# Patient Record
Sex: Female | Born: 1960 | Race: White | Hispanic: No | Marital: Married | Smoking: Former smoker
Health system: Southern US, Community
[De-identification: ages and names within clinical notes are randomized; demographics above are authoritative.]

## PROBLEM LIST (undated history)

## (undated) DIAGNOSIS — Z8619 Personal history of other infectious and parasitic diseases: Secondary | ICD-10-CM

## (undated) DIAGNOSIS — B379 Candidiasis, unspecified: Secondary | ICD-10-CM

## (undated) DIAGNOSIS — B009 Herpesviral infection, unspecified: Secondary | ICD-10-CM

## (undated) DIAGNOSIS — R002 Palpitations: Secondary | ICD-10-CM

## (undated) DIAGNOSIS — R000 Tachycardia, unspecified: Secondary | ICD-10-CM

## (undated) DIAGNOSIS — A749 Chlamydial infection, unspecified: Secondary | ICD-10-CM

## (undated) DIAGNOSIS — N970 Female infertility associated with anovulation: Secondary | ICD-10-CM

## (undated) DIAGNOSIS — N6019 Diffuse cystic mastopathy of unspecified breast: Secondary | ICD-10-CM

## (undated) DIAGNOSIS — E669 Obesity, unspecified: Secondary | ICD-10-CM

## (undated) HISTORY — PX: APPENDECTOMY: SHX54

## (undated) HISTORY — DX: Personal history of other infectious and parasitic diseases: Z86.19

## (undated) HISTORY — DX: Herpesviral infection, unspecified: B00.9

## (undated) HISTORY — PX: WISDOM TOOTH EXTRACTION: SHX21

## (undated) HISTORY — DX: Diffuse cystic mastopathy of unspecified breast: N60.19

## (undated) HISTORY — DX: Female infertility associated with anovulation: N97.0

## (undated) HISTORY — PX: INNER EAR SURGERY: SHX679

## (undated) HISTORY — PX: NASAL SEPTUM SURGERY: SHX37

## (undated) HISTORY — DX: Candidiasis, unspecified: B37.9

## (undated) HISTORY — DX: Obesity, unspecified: E66.9

## (undated) HISTORY — PX: LAPAROSCOPY: SHX197

## (undated) HISTORY — DX: Chlamydial infection, unspecified: A74.9

## (undated) HISTORY — DX: Palpitations: R00.2

## (undated) HISTORY — DX: Tachycardia, unspecified: R00.0

## (undated) HISTORY — PX: ECTOPIC PREGNANCY SURGERY: SHX613

---

## 1998-09-22 ENCOUNTER — Emergency Department (HOSPITAL_COMMUNITY): Admission: EM | Admit: 1998-09-22 | Discharge: 1998-09-22 | Payer: Self-pay | Admitting: Emergency Medicine

## 2000-11-26 ENCOUNTER — Other Ambulatory Visit: Admission: RE | Admit: 2000-11-26 | Discharge: 2000-11-26 | Payer: Self-pay | Admitting: Obstetrics and Gynecology

## 2001-06-26 ENCOUNTER — Ambulatory Visit (HOSPITAL_COMMUNITY): Admission: RE | Admit: 2001-06-26 | Discharge: 2001-06-26 | Payer: Self-pay | Admitting: Gastroenterology

## 2002-09-22 ENCOUNTER — Other Ambulatory Visit: Admission: RE | Admit: 2002-09-22 | Discharge: 2002-09-22 | Payer: Self-pay | Admitting: Obstetrics and Gynecology

## 2002-10-05 ENCOUNTER — Encounter: Admission: RE | Admit: 2002-10-05 | Discharge: 2002-10-05 | Payer: Self-pay | Admitting: Obstetrics and Gynecology

## 2002-10-05 ENCOUNTER — Encounter: Payer: Self-pay | Admitting: Obstetrics and Gynecology

## 2003-05-06 ENCOUNTER — Encounter: Admission: RE | Admit: 2003-05-06 | Discharge: 2003-05-06 | Payer: Self-pay | Admitting: Obstetrics and Gynecology

## 2003-05-06 ENCOUNTER — Encounter: Payer: Self-pay | Admitting: Obstetrics and Gynecology

## 2005-05-02 ENCOUNTER — Ambulatory Visit: Payer: Self-pay | Admitting: Internal Medicine

## 2006-03-07 ENCOUNTER — Ambulatory Visit: Payer: Self-pay | Admitting: Internal Medicine

## 2006-09-06 ENCOUNTER — Ambulatory Visit (HOSPITAL_COMMUNITY): Admission: RE | Admit: 2006-09-06 | Discharge: 2006-09-06 | Payer: Self-pay | Admitting: Gastroenterology

## 2006-11-13 ENCOUNTER — Encounter: Admission: RE | Admit: 2006-11-13 | Discharge: 2006-11-13 | Payer: Self-pay | Admitting: Obstetrics and Gynecology

## 2006-11-21 ENCOUNTER — Encounter (INDEPENDENT_AMBULATORY_CARE_PROVIDER_SITE_OTHER): Payer: Self-pay | Admitting: *Deleted

## 2006-11-21 ENCOUNTER — Ambulatory Visit (HOSPITAL_COMMUNITY): Admission: RE | Admit: 2006-11-21 | Discharge: 2006-11-21 | Payer: Self-pay | Admitting: General Surgery

## 2007-03-31 ENCOUNTER — Ambulatory Visit: Payer: Self-pay | Admitting: Internal Medicine

## 2008-03-22 ENCOUNTER — Encounter: Admission: RE | Admit: 2008-03-22 | Discharge: 2008-03-22 | Payer: Self-pay | Admitting: Obstetrics and Gynecology

## 2008-03-31 ENCOUNTER — Ambulatory Visit: Payer: Self-pay | Admitting: Internal Medicine

## 2009-05-06 ENCOUNTER — Encounter (INDEPENDENT_AMBULATORY_CARE_PROVIDER_SITE_OTHER): Payer: Self-pay | Admitting: *Deleted

## 2009-06-14 ENCOUNTER — Encounter (INDEPENDENT_AMBULATORY_CARE_PROVIDER_SITE_OTHER): Payer: Self-pay | Admitting: *Deleted

## 2009-11-25 ENCOUNTER — Encounter: Admission: RE | Admit: 2009-11-25 | Discharge: 2009-11-25 | Payer: Self-pay | Admitting: Obstetrics and Gynecology

## 2010-02-23 ENCOUNTER — Ambulatory Visit: Payer: Self-pay | Admitting: Internal Medicine

## 2010-02-23 DIAGNOSIS — R002 Palpitations: Secondary | ICD-10-CM

## 2010-02-23 DIAGNOSIS — E669 Obesity, unspecified: Secondary | ICD-10-CM | POA: Insufficient documentation

## 2010-02-23 DIAGNOSIS — I498 Other specified cardiac arrhythmias: Secondary | ICD-10-CM

## 2010-02-23 DIAGNOSIS — R0609 Other forms of dyspnea: Secondary | ICD-10-CM

## 2010-02-23 DIAGNOSIS — R0989 Other specified symptoms and signs involving the circulatory and respiratory systems: Secondary | ICD-10-CM | POA: Insufficient documentation

## 2010-04-28 ENCOUNTER — Ambulatory Visit (HOSPITAL_BASED_OUTPATIENT_CLINIC_OR_DEPARTMENT_OTHER): Admission: RE | Admit: 2010-04-28 | Discharge: 2010-04-28 | Payer: Self-pay | Admitting: Internal Medicine

## 2010-04-28 ENCOUNTER — Encounter: Payer: Self-pay | Admitting: Internal Medicine

## 2010-05-09 ENCOUNTER — Ambulatory Visit: Payer: Self-pay | Admitting: Pulmonary Disease

## 2010-11-02 NOTE — Assessment & Plan Note (Signed)
Summary: f1y/ gd  Medications Added PROMETRIUM 100 MG CAPS (PROGESTERONE MICRONIZED) 1 capsule daily KLS ALLERCLEAR D-24HR 10-240 MG XR24H-TAB (LORATADINE-PSEUDOEPHEDRINE) UAD ELESTRIN 0.52 MG/0.87 GM (0.06%) GEL (ESTRADIOL) UAd NASACORT AQ 55 MCG/ACT AERS (TRIAMCINOLONE ACETONIDE(NASAL)) UAD ATENOLOL 25 MG TABS (ATENOLOL) take 1 tablet daily      Allergies Added: ! SUDAFED  Visit Type:  Follow-up Primary Provider:  Deboraha Sprang   History of Present Illness: Mrs. Barmore returns today for followup.  She is a pleasant 50 yo woman with a h/o tachypalpitations and obesity which had been fairly well controlled over the past year.  She was started on estrogen replacement and noted increased frequency of her symptoms and her estrogen was stopped.  She is still taking as needed atenolol.  She also notes that her husband has noted that she is snoring more lately and c/o easy fatigueability.  She is not exercising.  She drinks two cups of coffee daily.  Current Medications (verified): 1)  Prometrium 100 Mg Caps (Progesterone Micronized) .Marland Kitchen.. 1 Capsule Daily 2)  Kls Allerclear D-24hr 10-240 Mg Xr24h-Tab (Loratadine-Pseudoephedrine) .... Uad 3)  Elestrin 0.52 Mg/0.87 Gm (0.06%) Gel (Estradiol) .... Uad 4)  Nasacort Aq 55 Mcg/act Aers (Triamcinolone Acetonide(Nasal)) .... Uad  Allergies (verified): 1)  ! Sudafed  Past History:  Past Medical History: Last updated: 02/23/2010 Current Problems:  OBESITY (ICD-278.00) PALPITATIONS (ICD-785.1) SINUS TACHYCARDIA (ICD-427.89)    Review of Systems  The patient denies chest pain, syncope, and peripheral edema.    Vital Signs:  Patient profile:   50 year old female Height:      63 inches Weight:      208 pounds BMI:     36.98 Pulse rate:   84 / minute BP sitting:   122 / 72  (left arm)  Vitals Entered By: Laurance Flatten CMA (Feb 23, 2010 9:30 AM)  Physical Exam  General:  Obese, well developed, well nourished, in no acute distress.  HEENT:  normal Neck: supple. No JVD. Carotids 2+ bilaterally no bruits Cor: RRR no rubs, gallops or murmur Lungs: CTA Ab: soft, nontender. nondistended. No HSM. Good bowel sounds Ext: warm. no cyanosis, clubbing or edema Neuro: alert and oriented. Grossly nonfocal. affect pleasant    EKG  Procedure date:  02/23/2010  Findings:      Normal sinus rhythm with rate of:  84.Left axis deviation.    Impression & Recommendations:  Problem # 1:  PALPITATIONS (ICD-785.1) These are improved.  She will continue her current meds.  I have encouraged her to start walking. Her updated medication list for this problem includes:    Atenolol 25 Mg Tabs (Atenolol) .Marland Kitchen... Take 1 tablet daily  Orders: Pulmonary Referral (Pulmonary)  Problem # 2:  OBESITY (ICD-278.00) She is trying to lose weight.  She will continue to exercise.  Patient Instructions: 1)  Your physician recommends that you schedule a follow-up appointment in: 1 year with Dr. [name] 2)  Your physician has recommended you make the following change in your medication: NEW medication:  Atenolol 25mg  1 tablet daily 3)  You have been referred to pulmonary for sleep evaluation Prescriptions: ATENOLOL 25 MG TABS (ATENOLOL) take 1 tablet daily  #30 x 11   Entered by:   Dennis Bast, RN, BSN   Authorized by:   Laren Boom, MD, [facility]   Signed by:   Dennis Bast, RN, BSN on 02/23/2010   Method used:   Electronically to        [id number] (retail)       [address] [place], [place]  [address]       Ph: [phone]       Fax: [phone]   RxID:   [id number]

## 2010-11-02 NOTE — Miscellaneous (Signed)
Summary: pulmonary function test  Clinical Lists Changes  Problems: Added new problem of SNORING (ICD-786.09) Orders: Added new Referral order of Sleep Disorder Referral (Sleep Disorder) - Signed

## 2011-02-13 NOTE — Assessment & Plan Note (Signed)
[facility]                         ELECTROPHYSIOLOGY OFFICE NOTE   [name], [name]                        MRN:          [id number]  DATE:03/31/2008                            DOB:          1960-10-03    Brandi Mcdaniel returns today for followup.  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  She returns today for followup.  She has  recently undergone a weight loss program starting several days ago,  where she has begun exercise walking several miles a day.  She feels  well with this, and she has had no symptoms.  She notes, however, since  she started exercising that at rest she has some back discomfort which  gets better with exertion.  Otherwise, no specific complaints today.   CURRENT MEDICATIONS:  1. Nasacort AQ.  2. Atenolol 25 a day.  3. Clarithromycin twice daily.   PHYSICAL EXAMINATION:  GENERAL:  She is a pleasant, well-appearing,  obese, middle-aged woman in no distress.  VITAL SIGNS:  Blood pressure 115/89, the pulse is 81 and regular,  respirations are 18, and weight is 205 pounds.  NECK:  Supple.  No jugular venous distention.  LUNGS:  Clear bilaterally to auscultation.  No wheezes, rales, or  rhonchi are present.  CARDIOVASCULAR:  Regular rate and rhythm.  Normal S1 and S2.  No  murmurs, rubs, or gallops.  ABDOMEN:  Soft and nontender.  EXTREMITIES:  No edema.   EKG demonstrates sinus rhythm with normal axis and intervals.   IMPRESSION:  1. Palpitations which have well controlled, thought secondary to sinus      tachycardia versus atrioventricular nodal reentry tachycardia.  2. Obesity.  3. Seasonal allergic rhinitis.   DISCUSSION:  Brandi Mcdaniel is stable and she is maintaining sinus rhythm  very nicely.  I have encouraged her on her exercise program.  I also  told her that if  she starts to have symptoms of back pain or chest pain  with exertion and she should call our office.     [name]. [name], MD  Electronically Signed   GWT/MedQ  DD: 03/31/2008  DT: 04/01/2008  Job #: [id number]

## 2011-02-13 NOTE — Assessment & Plan Note (Signed)
[facility]                         ELECTROPHYSIOLOGY OFFICE NOTE   RIVERLYN, KIZZIAH                        MRN:          [id number]  DATE:03/31/2007                            DOB:          09/30/1961    [name] returns today for followup.  She is a very pleasant middle-  aged woman with a history of tachy-palpitations and SVT (sinus  tachycardia versus AVNRT), who returns today for followup.  She has been  on low-dose Tenormin (25 mg daily), with nice control of her  arrhythmias.  She had no specific complaint today.  She notes minimal  palpitations in the last year.  She does note that she is trying to lose  some weight.  She also notes that she is under decreased stress now  compared to a year ago, as she has changed jobs, which suits her better.   PHYSICAL EXAMINATION:  GENERAL:  She is a pleasant, well-appearing,  middle-aged woman in no distress.  VITAL SIGNS:  Blood pressure was 104/80, pulse 84 and regular,  respirations 18, weight 209 pounds.  NECK:  Revealed no jugular venous distention.  There was no thyromegaly.  LUNGS:  Clear bilaterally to auscultation.  No wheezes, rales, or  rhonchi.  CARDIOVASCULAR:  Regular rate and rhythm, with normal S1 and S2.  EXTREMITIES:  Demonstrated no cyanosis, clubbing, or edema.   MEDICATIONS:  1. Nasacort.  2. Atenolol 25 a day.  3. Clarithromycin p.r.n.   Her EKG demonstrates sinus rhythm, with normal axis and intervals.   IMPRESSION:  1. Recurrent tachy-palpitations, now well controlled.  2. Beta blocker therapy secondary to #1.   DISCUSSION:  Overall, Brandi Mcdaniel is stable, and her palpitations and SVT  have been well controlled. We will plan to see her back in the office in  one year.  If she is doing well at that time, she can follow up with [place]  in two years.     Brandi Mcdaniel. Brandi Ridgel, MD  Electronically Signed    GWT/MedQ  DD: 03/31/2007  DT: 04/01/2007  Job #: [id number]

## 2011-02-16 NOTE — Procedures (Signed)
[facility]  Patient:    Brandi Mcdaniel, Brandi Mcdaniel Visit Number: [id number] MRN: [id number]          Service Type: END Location: ENDO Attending Physician:  Orland Mustard Proc. Date: 06/26/01 Admit Date:  06/26/2001   CC:         Oley Balm. Georgina Pillion, M.D.   Procedure Report  PROCEDURE:  Colonoscopy.  MEDICATIONS:  Fentanyl 70 mcg, Versed 7 mg IV.  INDICATION:  New onset of rectal bleeding in a 49 year old woman.  DESCRIPTION OF PROCEDURE:  The procedure had been explained to the patient and consent obtained.  With the patient in the left lateral decubitus position, the Olympus video colonoscope was inserted and advanced under direct visualization.  The prep was marginal but adequate.  The ascending colon, hepatic flexure, transverse colon, splenic flexure, descending, and sigmoid colon were seen well.  There were no polyps or other lesions seen.  No diverticula disease.  The scope was withdrawn to the rectum.  No polyps were seen in the rectum.  There were moderate sized internal hemorrhoids.  The scope was withdrawn.  The patient tolerated the procedure well.  ASSESSMENT:  Rectal bleeding, possibly due to internal hemorrhoids.  PLAN:  We will give the patient hemorrhoid instructions, fiber supplements, and see back in the office in two months. Attending Physician:  Orland Mustard DD:  06/26/01 TD:  06/26/01 Job: [id number]

## 2011-02-16 NOTE — Op Note (Signed)
Brandi Mcdaniel, MONTESANO                 ACCOUNT NO.:  [id number]   MEDICAL RECORD NO.:  [id number]          PATIENT TYPE:  AMB   LOCATION:  DAY                          FACILITY:  [facility]   PHYSICIAN:  Timothy E. Earlene Plater, M.D. DATE OF BIRTH:  1961-10-01   DATE OF PROCEDURE:  11/21/2006  DATE OF DISCHARGE:                               OPERATIVE REPORT   PREOPERATIVE DIAGNOSIS:  External hemorrhoids.   PROCEDURE:  External hemorrhoidectomy complete.   SURGEON:  Timothy E. Earlene Plater, M.D.   ANESTHESIA:  General.   Ms. Tranchina is 4,8 stable, obese, hypertension.  She has had external  hemorrhoids since younger ages and childbirth.  These have gotten  larger.  They have been irritated and on inspection in the office there  was considerable squamous change on the surface of these hemorrhoids  reminiscent of either warts, AIM or squamous cell cancer.  Because of  her pain, irritation and bleeding, she wishes to have them removed in  any case.  I quite agree.  She has been prepped and is ready to proceed.  She is identified and the permit signed.   The patient taken to the operating room, placed supine.  General  endotracheal anesthesia administered.  She was placed in lithotomy.  Perianal area inspected, prepped and draped in the usual fashion.  Hemorrhoids were present in a continuous hood around the anterior, right  anterior, and left anterior anal orifice.  The squamous changes and  dangerous areas were in the left anterior perianal skin.  There was a  tag on the right posterior and a sizable left posterior external  hemorrhoid.  I removed the hood in one section, undermined the edges and  I closed that wound with interrupted 3-0 chromic.  The tag was excised  with cautery on the right posterior and then the left posterior  hemorrhoid was excised undermining the edges and closed with running 3-0  chromic.  The results are satisfactory.  There were no excesses, good  stretch of the anal orifice  and of course the sphincter was not  involved.  So a dressing was applied.  Counts were correct.  She  tolerated it well, was removed to recovery room.  Written and verbal  instructions given her and her husband and she will be followed as an  outpatient.      Timothy E. Earlene Plater, M.D.  Electronically Signed     TED/MEDQ  D:  11/21/2006  T:  11/21/2006  Job:  [id number]   cc:   Oley Balm. Georgina Pillion, M.D.  Fax: [phone]   Llana Aliment. Malon Kindle., M.D.  Fax: [phone]

## 2011-02-16 NOTE — Op Note (Signed)
Brandi Mcdaniel, Brandi Mcdaniel                 ACCOUNT NO.:  [id number]   MEDICAL RECORD NO.:  [id number]          PATIENT TYPE:  AMB   LOCATION:  ENDO                         FACILITY:  [facility]   PHYSICIAN:  James L. Malon Kindle., M.D.DATE OF BIRTH:  [birth year]   DATE OF PROCEDURE:  09/06/2006  DATE OF DISCHARGE:                               OPERATIVE REPORT   PROCEDURE:  Colonoscopy.   ENDOSCOPIST:  Llana Aliment. Edwards, M.D.   MEDICATIONS:  Fentanyl 100 mcg, Versed 8 mg IV.   SCOPE:  Pentax adult colonoscope.   INDICATIONS:  Persistent rectal bleeding in a woman felt to be due to  internal hemorrhoids.  This was done in anticipation of possible  hemorrhoid surgery.   DESCRIPTION OF PROCEDURE:  Procedure had been explained to the patient  and consent obtained.  In the left lateral decubitus position, the  Olympus scope was inserted and advanced.  The prep was excellent.  The  patient had some scattered diverticula throughout colon.  After moving  in different positions, using abdominal pressure and finally putting her  on her right side, we were able to reach the cecum.  The appendiceal  orifice was seen and photographed.  The scope was withdrawn and the  cecum, ascending, transverse, descending and sigmoid colon seen well, no  polyps or lesions seen, again just a few scattered diverticula.  It was  retroflexed in the rectum was some internal hemorrhoids.  Upon removal,  the patient did have an anal polyp.  This could have been a large  thrombosed hemorrhoid; it was difficult to tell.   ASSESSMENT:  1. Internal and external hemorrhoids and possible anal polyp.  2. Slight diverticulosis.   PLAN:  We will make arrangements for her to see Dr. Kendrick Ranch to  consider surgical intervention.           ______________________________  Llana Aliment Malon Kindle., M.D.     Waldron Session  D:  09/06/2006  T:  09/06/2006  Job:  [id number]   cc:   Oley Balm. Georgina Pillion, M.D.  Timothy E. Earlene Plater, M.D.

## 2011-04-18 ENCOUNTER — Other Ambulatory Visit: Payer: Self-pay | Admitting: Obstetrics and Gynecology

## 2011-04-18 DIAGNOSIS — Z1231 Encounter for screening mammogram for malignant neoplasm of breast: Secondary | ICD-10-CM

## 2011-04-19 ENCOUNTER — Ambulatory Visit
Admission: RE | Admit: 2011-04-19 | Discharge: 2011-04-19 | Disposition: A | Discharge status: Home / Self Care | Payer: Federal, State, Local not specified - PPO | Source: Ambulatory Visit | Attending: Obstetrics and Gynecology | Admitting: Obstetrics and Gynecology

## 2011-04-19 DIAGNOSIS — Z1231 Encounter for screening mammogram for malignant neoplasm of breast: Secondary | ICD-10-CM

## 2011-04-23 ENCOUNTER — Other Ambulatory Visit: Payer: Self-pay | Admitting: Obstetrics and Gynecology

## 2011-04-23 DIAGNOSIS — R928 Other abnormal and inconclusive findings on diagnostic imaging of breast: Secondary | ICD-10-CM

## 2011-04-27 ENCOUNTER — Ambulatory Visit
Admission: RE | Admit: 2011-04-27 | Discharge: 2011-04-27 | Disposition: A | Discharge status: Home / Self Care | Payer: Federal, State, Local not specified - PPO | Source: Ambulatory Visit | Attending: Obstetrics and Gynecology | Admitting: Obstetrics and Gynecology

## 2011-04-27 DIAGNOSIS — R928 Other abnormal and inconclusive findings on diagnostic imaging of breast: Secondary | ICD-10-CM

## 2011-05-02 ENCOUNTER — Other Ambulatory Visit: Payer: Self-pay | Admitting: Family Medicine

## 2011-05-02 ENCOUNTER — Ambulatory Visit
Admission: RE | Admit: 2011-05-02 | Discharge: 2011-05-02 | Disposition: A | Discharge status: Home / Self Care | Payer: Federal, State, Local not specified - PPO | Source: Ambulatory Visit | Attending: Family Medicine | Admitting: Family Medicine

## 2011-05-02 DIAGNOSIS — S93609A Unspecified sprain of unspecified foot, initial encounter: Secondary | ICD-10-CM

## 2011-05-09 ENCOUNTER — Encounter: Payer: Self-pay | Admitting: Internal Medicine

## 2011-06-06 ENCOUNTER — Ambulatory Visit (INDEPENDENT_AMBULATORY_CARE_PROVIDER_SITE_OTHER): Payer: Federal, State, Local not specified - PPO | Admitting: Internal Medicine

## 2011-06-06 ENCOUNTER — Encounter: Payer: Self-pay | Admitting: Internal Medicine

## 2011-06-06 DIAGNOSIS — R002 Palpitations: Secondary | ICD-10-CM

## 2011-06-06 DIAGNOSIS — E669 Obesity, unspecified: Secondary | ICD-10-CM

## 2011-06-06 NOTE — Patient Instructions (Signed)
Your physician wants you to follow-up in: one year You will receive a reminder letter in the mail two months in advance. If you don't receive a letter, please call our office to schedule the follow-up appointment.  

## 2011-06-06 NOTE — Assessment & Plan Note (Signed)
We discussed the importance of diet and exercise. She stated that she would continue to try and lose weight.

## 2011-06-06 NOTE — Progress Notes (Signed)
HPI Mrs. Crable returns today for followup. She is a very pleasant 50 year old woman with a history of tachycardia palpitations. It's unclear whether these are due to sinus tachycardia or SVT. Since I saw her last several months ago, her symptoms have been well-controlled. She continues to exercise. She has lost an additional 10 pounds but gained this back. She notes that she struggles with diet and exercise. She has not had syncope. She denies chest pain or shortness of breath. Allergies  Allergen Reactions  . Pseudoephedrine      Current Outpatient Prescriptions  Medication Sig Dispense Refill  . atenolol (TENORMIN) 25 MG tablet Take 25 mg by mouth daily. As needed      . Cholecalciferol (VITAMIN D) 2000 UNITS tablet Take 2,000 Units by mouth daily.        . fish oil-omega-3 fatty acids 1000 MG capsule Take 2 g by mouth daily.        Marland Kitchen loratadine-pseudoephedrine (CLARITIN-D 24-HOUR) 10-240 MG per 24 hr tablet Use as directed       . Multiple Vitamin (MULTIVITAMIN) capsule Take 1 capsule by mouth daily.        Marland Kitchen triamcinolone (NASACORT AQ) 55 MCG/ACT nasal inhaler Use as directed          Past Medical History  Diagnosis Date  . Obesity   . Palpitation   . Sinus tachycardia     ROS:   All systems reviewed and negative except as noted in the HPI.   No past surgical history on file.   No family history on file.   History   Social History  . Marital Status: Married    Spouse Name: N/A    Number of Children: N/A  . Years of Education: N/A   Occupational History  . Not on file.   Social History Main Topics  . Smoking status: Former Smoker    Quit date: 10/01/1984  . Smokeless tobacco: Not on file  . Alcohol Use: Not on file  . Drug Use: Not on file  . Sexually Active: Not on file   Other Topics Concern  . Not on file   Social History Narrative  . No narrative on file     BP 118/86  Pulse 90  Physical Exam:  Well appearing NAD HEENT: Unremarkable Neck:   No JVD, no thyromegally Lymphatics:  No adenopathy Back:  No CVA tenderness Lungs:  Clear with no wheezes, rales, or rhonchi. HEART:  Regular rate rhythm, no murmurs, no rubs, no clicks Abd:  soft, positive bowel sounds, no organomegally, no rebound, no guarding Ext:  2 plus pulses, no edema, no cyanosis, no clubbing Skin:  No rashes no nodules Neuro:  CN II through XII intact, motor grossly intact   Assess/Plan:

## 2011-06-06 NOTE — Assessment & Plan Note (Signed)
Her symptoms remain well controlled. She will continue her medications as needed.

## 2012-05-15 ENCOUNTER — Telehealth: Payer: Self-pay | Admitting: *Deleted

## 2012-05-15 NOTE — Telephone Encounter (Signed)
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

## 2012-06-30 ENCOUNTER — Ambulatory Visit (INDEPENDENT_AMBULATORY_CARE_PROVIDER_SITE_OTHER): Payer: Federal, State, Local not specified - PPO | Admitting: Internal Medicine

## 2012-06-30 ENCOUNTER — Encounter: Payer: Self-pay | Admitting: Internal Medicine

## 2012-06-30 VITALS — BP 114/88 | HR 83 | Ht 63.0 in | Wt 192.8 lb

## 2012-06-30 DIAGNOSIS — E669 Obesity, unspecified: Secondary | ICD-10-CM

## 2012-06-30 DIAGNOSIS — R002 Palpitations: Secondary | ICD-10-CM

## 2012-06-30 NOTE — Patient Instructions (Addendum)
Your physician wants you to follow-up in: 6 months with Dr. [name].   You will receive a reminder letter in the mail two months in advance. If you don't receive a letter, please call our office to schedule the follow-up appointment.   Your physician has recommended you make the following change in your medication: STOP GREEN COFFEE BEAN   PURCHASE BP CUFF WITH HEART RATE ATTACHED WITH CUFF

## 2012-06-30 NOTE — Assessment & Plan Note (Signed)
She is lost approximately 10 pounds in the last year. I've encouraged her to increase her physical activity. She is counting calories.

## 2012-06-30 NOTE — Progress Notes (Signed)
HPI [name] returns today for followup. She is a very pleasant middle-age woman with a history of tachycardia palpitations. The patient has lost over 10 pounds in the last year. She notes that when she goes to bed at night, she will feel like her heart is beating irregularly. At other times, she notes a rapid irregular heartbeat. This is not related to exertion. She has been taking dietary supplements. These do not reportedly have caffeine but are made of coffee extract. I suspect they are providing some sort of stimulant, and thereby increasing the propensity for heart racing. Allergies  Allergen Reactions  . Pseudoephedrine      Current Outpatient Prescriptions  Medication Sig Dispense Refill  . atenolol (TENORMIN) 25 MG tablet Take 25 mg by mouth daily. As needed      . Cholecalciferol (VITAMIN D) 2000 UNITS tablet Take 2,000 Units by mouth daily.        . fish oil-omega-3 fatty acids 1000 MG capsule Take 2 g by mouth daily.        Chilton Si Coffee Bean-Yerba Mate (GREEN COFFEE BEAN EXTRACT PO) Take by mouth daily.      Marland Kitchen loratadine (CLARITIN) 10 MG tablet Take 10 mg by mouth daily.      . Multiple Vitamin (MULTIVITAMIN) capsule Take 1 capsule by mouth daily.        . NON FORMULARY Taking over the counter Fiber      . triamcinolone (NASACORT AQ) 55 MCG/ACT nasal inhaler Use as directed          Past Medical History  Diagnosis Date  . Obesity   . Palpitation   . Sinus tachycardia     ROS:   All systems reviewed and negative except as noted in the HPI.   No past surgical history on file.   No family history on file.   History   Social History  . Marital Status: Married    Spouse Name: N/A    Number of Children: N/A  . Years of Education: N/A   Occupational History  . Not on file.   Social History Main Topics  . Smoking status: Former Smoker    Quit date: 10/01/1984  . Smokeless tobacco: Not on file  . Alcohol Use: Not on file  . Drug Use: Not on file  .  Sexually Active: Not on file   Other Topics Concern  . Not on file   Social History Narrative  . No narrative on file     BP 114/88  Pulse 83  Ht 5\' 3"  (1.6 m)  Wt 192 lb 12.8 oz (87.454 kg)  BMI 34.15 kg/m2  Physical Exam:  Well appearing middle-aged woman, NAD HEENT: Unremarkable Neck:  No JVD, no thyromegally Lungs:  Clear with no wheezes, rales, or rhonchi. HEART:  Regular rate rhythm, no murmurs, no rubs, no clicks Abd:  soft, positive bowel sounds, no organomegally, no rebound, no guarding Ext:  2 plus pulses, no edema, no cyanosis, no clubbing Skin:  No rashes no nodules Neuro:  CN II through XII intact, motor grossly intact  EKG Normal sinus rhythm with incomplete right bundle branch block.  Assess/Plan:

## 2012-06-30 NOTE — Assessment & Plan Note (Signed)
Her symptoms have increased. I suspect they are related at least in part to her dietary supplement which is coffee being extract. I've asked her to stop the supplement. I've recommended that she obtain a blood pressure and heart rate monitor so that we can better correlate her symptoms to her heart rate and blood pressure. If we were to document rapid heart rates, then I would recommend consideration for catheter ablation or up titration of her medical therapy.

## 2012-07-16 ENCOUNTER — Encounter: Payer: Self-pay | Admitting: Obstetrics and Gynecology

## 2012-07-16 ENCOUNTER — Other Ambulatory Visit: Payer: Self-pay | Admitting: Obstetrics and Gynecology

## 2012-07-16 ENCOUNTER — Ambulatory Visit (INDEPENDENT_AMBULATORY_CARE_PROVIDER_SITE_OTHER): Payer: Federal, State, Local not specified - PPO | Admitting: Obstetrics and Gynecology

## 2012-07-16 VITALS — BP 118/68 | Ht 63.0 in | Wt 193.0 lb

## 2012-07-16 DIAGNOSIS — N970 Female infertility associated with anovulation: Secondary | ICD-10-CM

## 2012-07-16 DIAGNOSIS — Z124 Encounter for screening for malignant neoplasm of cervix: Secondary | ICD-10-CM

## 2012-07-16 DIAGNOSIS — N951 Menopausal and female climacteric states: Secondary | ICD-10-CM

## 2012-07-16 DIAGNOSIS — E669 Obesity, unspecified: Secondary | ICD-10-CM

## 2012-07-16 DIAGNOSIS — A749 Chlamydial infection, unspecified: Secondary | ICD-10-CM | POA: Insufficient documentation

## 2012-07-16 DIAGNOSIS — Z1231 Encounter for screening mammogram for malignant neoplasm of breast: Secondary | ICD-10-CM

## 2012-07-16 DIAGNOSIS — R002 Palpitations: Secondary | ICD-10-CM

## 2012-07-16 DIAGNOSIS — B009 Herpesviral infection, unspecified: Secondary | ICD-10-CM | POA: Insufficient documentation

## 2012-07-16 DIAGNOSIS — N6019 Diffuse cystic mastopathy of unspecified breast: Secondary | ICD-10-CM

## 2012-07-16 NOTE — Progress Notes (Signed)
ANNUAL:  Last Pap: 2012 WNL: Yes.  Single episode of abnl many yrs ago.  No rx required Regular Periods:no  Bled shorly during HRT.  No HRT for at least 2 years. Contraception: Post-Menopause  Monthly Breast exam:yes Tetanus<69yrs:yes Nl.Bladder Function:yes Daily BMs:yes Healthy Diet:yes Calcium:no Mammogram:yes [facility]  Due now for Mammogram Exercise:no Have often Exercise: n/a Seatbelt: yes Abuse at home: no Stressful work:yes Sigmoid-colonoscopy: 9 years ago Bone Density: No PCP: Koirala / [facility] Change in PMH: None Change in [facility]: Mom has osteoporosis and rheumatoid arthritis.  Subjective:    Brandi Mcdaniel is a 51 y.o. female G3P0030 who presents for annual exam.  The patient has menopausal sx, all of which are improving with change in diet.  Now taking OTC supplement which she thinks helps as well.  Hot flashes improved with avoidance of alcohol.  Vaginal dryness remains an issue   The following portions of the patient's history were reviewed and updated as appropriate: allergies, current medications, past family history, past medical history, past social history, past surgical history and problem list.  Review of Systems Pertinent items are noted in HPI. Gastrointestinal:No change in bowel habits, no abdominal pain, no rectal bleeding.  Hx hemorrhoid surgery.  Eats more fiber now Genitourinary:negative for dysuria, frequency, hematuria, nocturia and urinary incontinence    Objective:     There were no vitals taken for this visit.  Weight:  Wt Readings from Last 1 Encounters:  06/30/12 192 lb 12.8 oz (87.454 kg)     BMI: There is no height or weight on file to calculate BMI. General Appearance: Alert, appropriate appearance for age. No acute distress HEENT: Grossly normal Neck / Thyroid: Supple, no masses, nodes or enlargement Lungs: clear to auscultation bilaterally Back: No CVA tenderness Breast Exam: No masses or  nodes.No dimpling, nipple retraction or discharge.  Fibrocystic changes bilaterally Cardiovascular: Regular rate and rhythm. S1, S2, no murmur Gastrointestinal: Soft, non-tender, no masses or organomegaly Pelvic Exam: External genitalia: normal general appearance Vaginal: atrophic mucosa Cervix: atrophic with contact bleeding Adnexa: non palpable Uterus: doesn't feel enlarged Exam limited by body habitus Rectovaginal: normal rectal, no masses Lymphatic Exam: Non-palpable nodes in neck, clavicular, axillary, or inguinal regions Skin: no rash or abnormalities Neurologic: Normal gait and speech, no tremor  Psychiatric: Alert and oriented, appropriate affect.    Urinalysis:Not done    Assessment:    Hormone replacement therapy in past with sx of breast tenderness so stopped after only 2 months Menopause with improved sx with dietary changes. Minimal info re her mom's menopause Family hx osteoporosis   Plan:   mammogram pap smear Follow-up:  For DXA and review of results as well as review of menopausal options. EVOO recommended for lubrication   Dierdre Forth MD

## 2012-07-22 ENCOUNTER — Ambulatory Visit
Admission: RE | Admit: 2012-07-22 | Discharge: 2012-07-22 | Disposition: A | Discharge status: Home / Self Care | Payer: Federal, State, Local not specified - PPO | Source: Ambulatory Visit | Attending: Obstetrics and Gynecology | Admitting: Obstetrics and Gynecology

## 2012-07-22 DIAGNOSIS — Z1231 Encounter for screening mammogram for malignant neoplasm of breast: Secondary | ICD-10-CM

## 2012-07-28 ENCOUNTER — Encounter: Payer: Self-pay | Admitting: Obstetrics and Gynecology

## 2012-08-04 ENCOUNTER — Encounter: Payer: Federal, State, Local not specified - PPO | Admitting: Obstetrics and Gynecology

## 2012-08-04 ENCOUNTER — Other Ambulatory Visit: Payer: Federal, State, Local not specified - PPO

## 2012-08-06 ENCOUNTER — Other Ambulatory Visit: Payer: Federal, State, Local not specified - PPO

## 2012-08-06 ENCOUNTER — Ambulatory Visit (INDEPENDENT_AMBULATORY_CARE_PROVIDER_SITE_OTHER): Payer: Federal, State, Local not specified - PPO

## 2012-08-06 DIAGNOSIS — Z8262 Family history of osteoporosis: Secondary | ICD-10-CM

## 2012-08-06 DIAGNOSIS — M899 Disorder of bone, unspecified: Secondary | ICD-10-CM

## 2012-08-14 LAB — PAP IG AND HPV HIGH-RISK: HPV, high-risk: NEGATIVE

## 2012-08-19 ENCOUNTER — Other Ambulatory Visit: Payer: Self-pay

## 2012-08-19 DIAGNOSIS — E559 Vitamin D deficiency, unspecified: Secondary | ICD-10-CM

## 2012-08-19 MED ORDER — ERGOCALCIFEROL 1.25 MG (50000 UT) PO CAPS: 50000.0000 [IU] | ORAL_CAPSULE | ORAL | Status: AC

## 2012-11-19 ENCOUNTER — Other Ambulatory Visit: Payer: Federal, State, Local not specified - PPO

## 2013-06-03 ENCOUNTER — Other Ambulatory Visit: Payer: Self-pay | Admitting: Obstetrics and Gynecology

## 2013-06-03 DIAGNOSIS — N644 Mastodynia: Secondary | ICD-10-CM

## 2013-07-23 ENCOUNTER — Ambulatory Visit
Admission: RE | Admit: 2013-07-23 | Discharge: 2013-07-23 | Disposition: A | Discharge status: Home / Self Care | Payer: Federal, State, Local not specified - PPO | Source: Ambulatory Visit | Attending: Obstetrics and Gynecology | Admitting: Obstetrics and Gynecology

## 2013-07-23 DIAGNOSIS — N644 Mastodynia: Secondary | ICD-10-CM

## 2013-12-25 ENCOUNTER — Ambulatory Visit (HOSPITAL_BASED_OUTPATIENT_CLINIC_OR_DEPARTMENT_OTHER): Payer: Federal, State, Local not specified - PPO | Attending: Family Medicine

## 2013-12-25 VITALS — Ht 62.0 in | Wt 201.0 lb

## 2013-12-25 DIAGNOSIS — G4733 Obstructive sleep apnea (adult) (pediatric): Secondary | ICD-10-CM

## 2013-12-26 DIAGNOSIS — G4733 Obstructive sleep apnea (adult) (pediatric): Secondary | ICD-10-CM

## 2013-12-26 NOTE — Sleep Study (Signed)
   NAME: Brandi JupiterVicki D Codispoti DATE OF BIRTH:  03/19/1961 MEDICAL RECORD NUMBER [id number]  LOCATION: [facility]  PHYSICIAN: Garyson Stelly D  DATE OF STUDY: 12/25/2013  SLEEP STUDY TYPE: Nocturnal Polysomnogram               REFERRING PHYSICIAN: Koirala, Dibas, MD  INDICATION FOR STUDY: Hypersomnia with sleep apnea  EPWORTH SLEEPINESS SCORE:   13/24 HEIGHT: 5\' 2"  (157.5 cm)  WEIGHT: 201 lb (91.173 kg)    Body mass index is 36.75 kg/(m^2).  NECK SIZE: 15 in.  MEDICATIONS: Charted for review  SLEEP ARCHITECTURE: Total sleep time 308.5 minutes with sleep efficiency 76.6%. Stage I was 4.7%, stage II 75.9%, stage III 3.9%, REM 15.6% of total sleep time. Sleep latency 30 minutes, REM latency 65 minutes, awake after sleep onset 64 minutes, arousal index 6.8. Bedtime medication: None  RESPIRATORY DATA: Apnea hypopneas index (AHI) 11.1 per hour. 57 total events scored including 2 obstructive apneas and 55 hypopneas. Events were not positional. REM AHI 45 per hour. There were not enough early events to meet protocol requirements for split CPAP titration.  OXYGEN DATA: Moderately loud snoring with oxygen desaturation to a nadir of 80% and mean oxygen saturation through the study of 93.8% on room air.  CARDIAC DATA: Sinus rhythm with PACs  MOVEMENT/PARASOMNIA: No significant movement disturbance, no bathroom trips  IMPRESSION/ RECOMMENDATION:   1) Mild obstructive sleep apnea/hypopneas syndrome, AHI 11.1 per hour with a non-positional events. REM AHI 45 per hour. Moderately loud snoring with oxygen desaturation to a nadir of 80% and mean oxygen saturation through the study of 93.8% on room air. 2) There were not enough early events to permit application of split protocol CPAP titration on this study. If appropriate, the patient can return for dedicated CPAP titration study.  3) A baseline polysomnogram on 04/28/2010 record AHI 10 per hour with body weight 200 pounds for that  study.  Signed Jetty Duhamellinton Lue Sykora M.D. Waymon BudgeYOUNG,[place] D Diplomate, American Board of Sleep Medicine  ELECTRONICALLY SIGNED ON:  12/26/2013, 1:44 PM  [facility] PH: [phone]   FX: [phone] ACCREDITED BY THE AMERICAN ACADEMY OF SLEEP MEDICINE

## 2014-06-23 ENCOUNTER — Other Ambulatory Visit: Payer: Self-pay

## 2014-06-23 DIAGNOSIS — Z1231 Encounter for screening mammogram for malignant neoplasm of breast: Secondary | ICD-10-CM

## 2014-07-26 ENCOUNTER — Ambulatory Visit: Payer: Federal, State, Local not specified - PPO

## 2014-07-26 ENCOUNTER — Ambulatory Visit
Admission: RE | Admit: 2014-07-26 | Discharge: 2014-07-26 | Disposition: A | Discharge status: Home / Self Care | Payer: Federal, State, Local not specified - PPO | Source: Ambulatory Visit

## 2014-07-26 DIAGNOSIS — Z1231 Encounter for screening mammogram for malignant neoplasm of breast: Secondary | ICD-10-CM

## 2014-08-02 ENCOUNTER — Encounter: Payer: Self-pay | Admitting: Obstetrics and Gynecology

## 2015-07-22 ENCOUNTER — Other Ambulatory Visit: Payer: Self-pay | Admitting: Rheumatology

## 2015-07-22 DIAGNOSIS — R945 Abnormal results of liver function studies: Principal | ICD-10-CM

## 2015-07-22 DIAGNOSIS — R7989 Other specified abnormal findings of blood chemistry: Secondary | ICD-10-CM

## 2015-07-25 ENCOUNTER — Other Ambulatory Visit: Payer: Self-pay

## 2015-07-25 DIAGNOSIS — Z1231 Encounter for screening mammogram for malignant neoplasm of breast: Secondary | ICD-10-CM

## 2015-07-28 ENCOUNTER — Other Ambulatory Visit: Payer: Federal, State, Local not specified - PPO

## 2015-07-29 ENCOUNTER — Ambulatory Visit
Admission: RE | Admit: 2015-07-29 | Discharge: 2015-07-29 | Disposition: A | Discharge status: Home / Self Care | Payer: Federal, State, Local not specified - PPO | Source: Ambulatory Visit | Attending: Rheumatology | Admitting: Rheumatology

## 2015-07-29 DIAGNOSIS — R7989 Other specified abnormal findings of blood chemistry: Secondary | ICD-10-CM

## 2015-07-29 DIAGNOSIS — R945 Abnormal results of liver function studies: Principal | ICD-10-CM

## 2015-08-31 ENCOUNTER — Ambulatory Visit
Admission: RE | Admit: 2015-08-31 | Discharge: 2015-08-31 | Disposition: A | Discharge status: Home / Self Care | Payer: Federal, State, Local not specified - PPO | Source: Ambulatory Visit

## 2015-08-31 DIAGNOSIS — Z1231 Encounter for screening mammogram for malignant neoplasm of breast: Secondary | ICD-10-CM

## 2016-07-23 ENCOUNTER — Other Ambulatory Visit: Payer: Self-pay | Admitting: Obstetrics and Gynecology

## 2016-07-23 DIAGNOSIS — Z1231 Encounter for screening mammogram for malignant neoplasm of breast: Secondary | ICD-10-CM

## 2016-08-31 ENCOUNTER — Ambulatory Visit
Admission: RE | Admit: 2016-08-31 | Discharge: 2016-08-31 | Disposition: A | Discharge status: Home / Self Care | Payer: Federal, State, Local not specified - PPO | Source: Ambulatory Visit | Attending: Obstetrics and Gynecology | Admitting: Obstetrics and Gynecology

## 2016-08-31 DIAGNOSIS — Z1231 Encounter for screening mammogram for malignant neoplasm of breast: Secondary | ICD-10-CM

## 2016-12-26 IMAGING — US US ABDOMEN COMPLETE
1 series · 14 of 25 positions shown · non-contrast
Comparison: None.

CLINICAL DATA: Elevated liver function tests

EXAM:
ULTRASOUND ABDOMEN COMPLETE

[Series 1: us abdomen complete · 0.26mm/px · 14 of 72 slices shown]
[im 1/72]
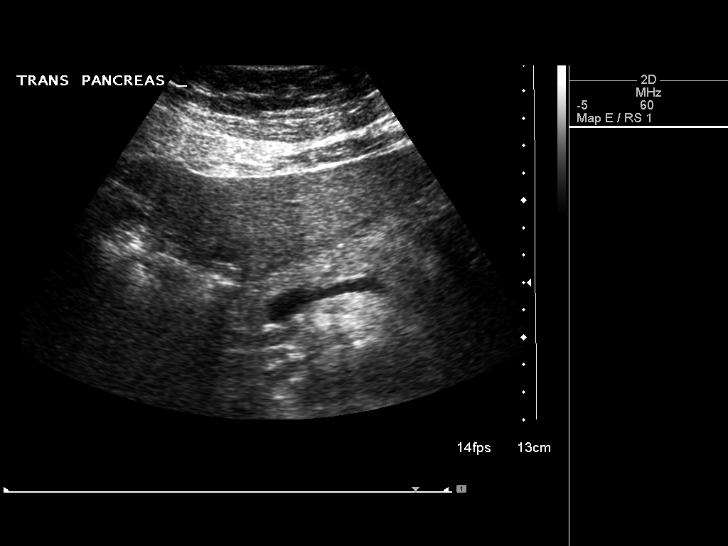
[im 6/72]
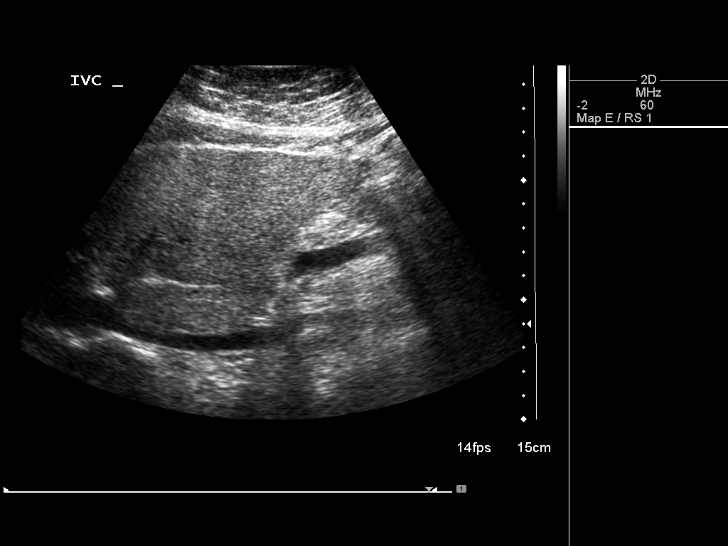
[im 12/72]
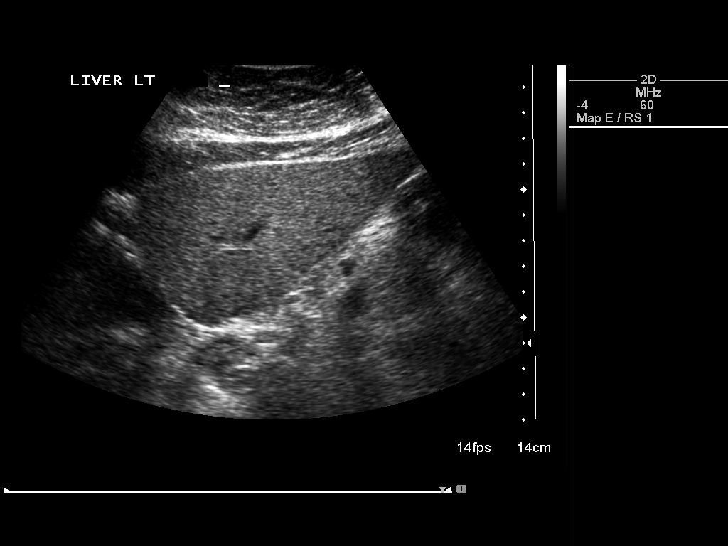
[im 18/72]
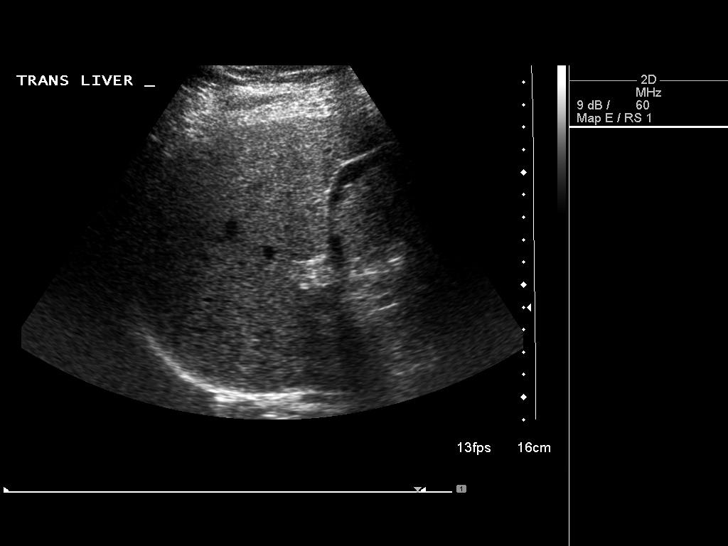
[im 24/72]
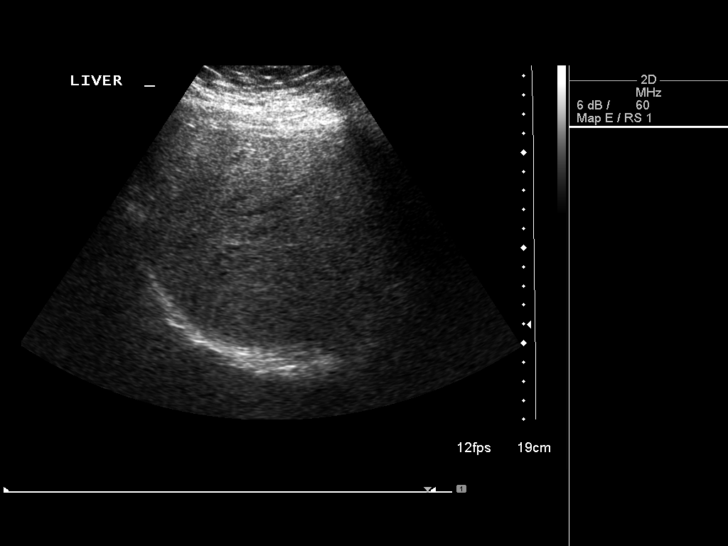
[im 27/72]
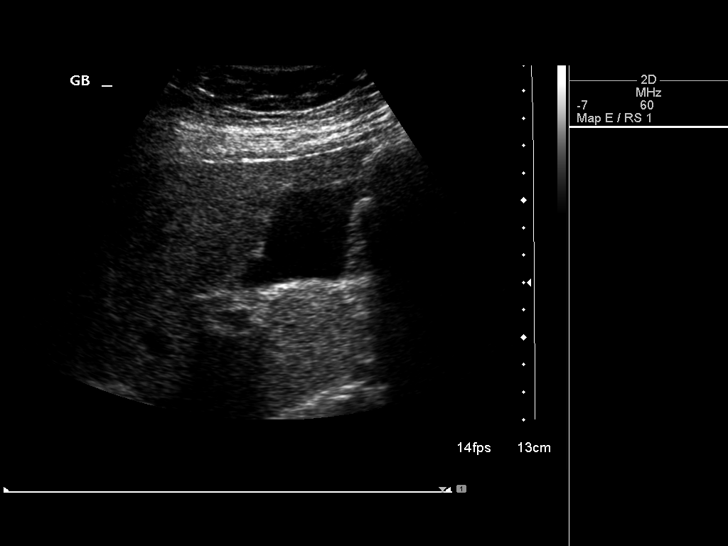
[im 33/72]
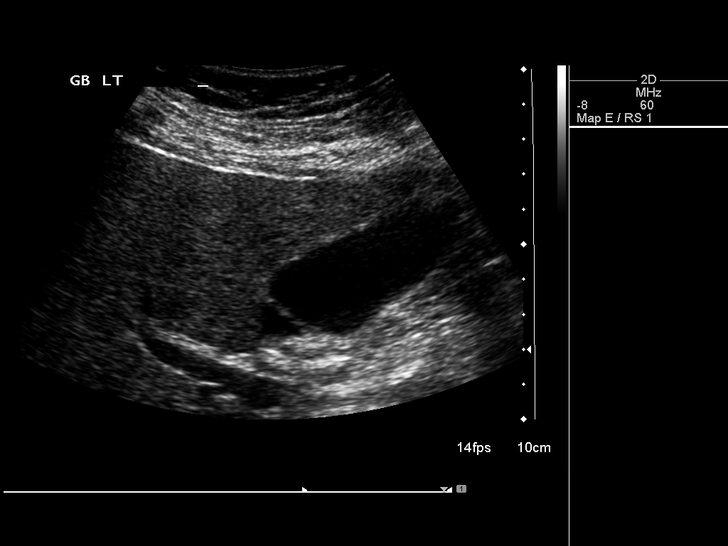
[im 39/72]
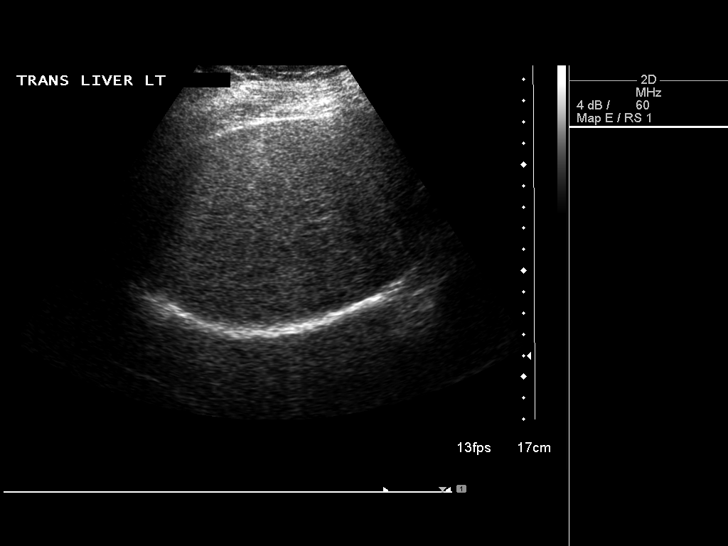
[im 45/72]
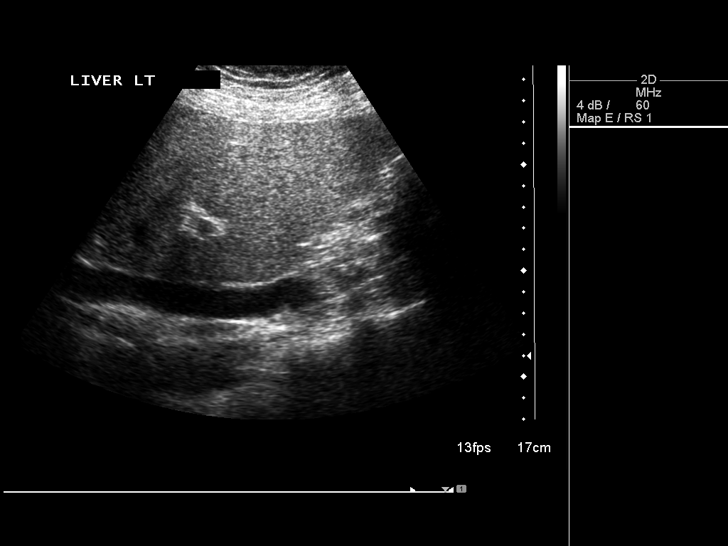
[im 48/72]
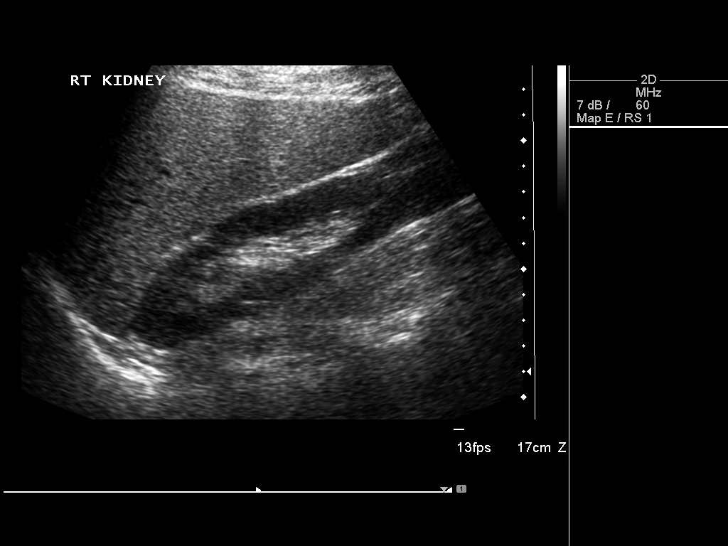
[im 54/72]
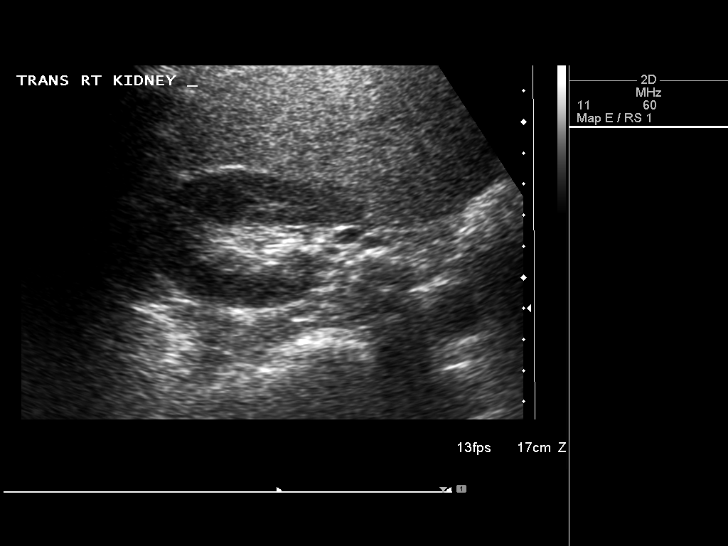
[im 60/72]
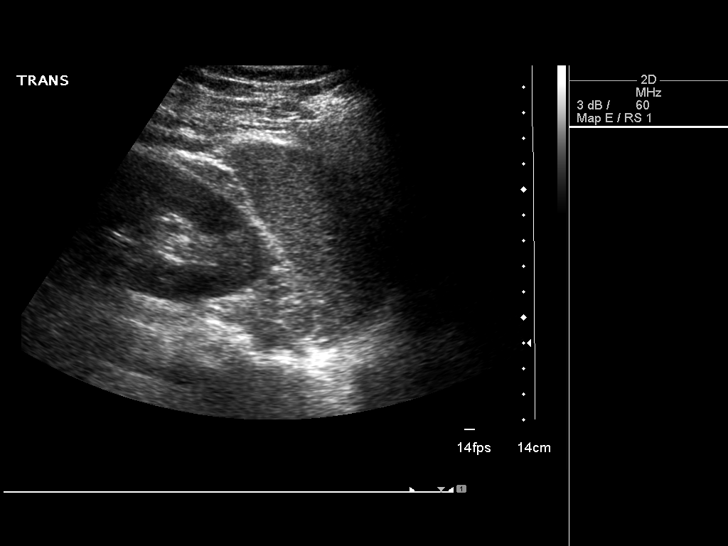
[im 66/72]
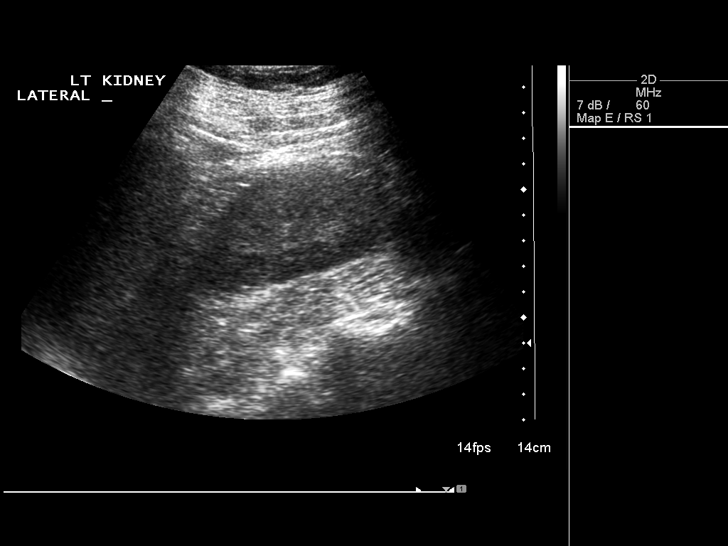
[im 72/72]
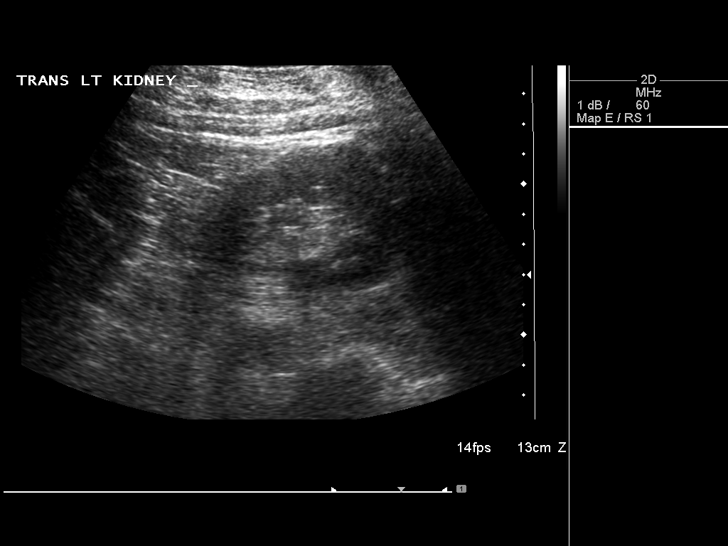

[14 of 25 positions shown; findings below may reference images not displayed]

FINDINGS: Gallbladder: No gallstones or wall thickening visualized. No
sonographic Murphy sign noted.

Common bile duct: Diameter: 4 mm. Where visualized, no filling
defect.

Liver: No focal lesion identified. Within normal limits in
parenchymal echogenicity.

IVC: No abnormality visualized.

Pancreas: Visualized portion unremarkable.

Spleen: Size and appearance within normal limits.

Right Kidney: Length: 12 cm. Echogenicity within normal limits. No
mass or hydronephrosis visualized.

Left Kidney: Length: 12 cm. Echogenicity within normal limits. No
mass or hydronephrosis visualized.

Abdominal aorta: No aneurysm visualized.

Other findings: None.
IMPRESSION: Normal abdominal ultrasound.
